# Patient Record
Sex: Male | Born: 1975 | Race: White | Hispanic: No | Marital: Single | Smoking: Never smoker
Health system: Southern US, Community
[De-identification: ages and names within clinical notes are randomized; demographics above are authoritative.]

## PROBLEM LIST (undated history)

## (undated) HISTORY — PX: INNER EAR SURGERY: SHX679

---

## 2006-03-28 ENCOUNTER — Emergency Department: Payer: Self-pay | Admitting: Unknown Physician Specialty

## 2007-09-17 ENCOUNTER — Emergency Department: Payer: Self-pay | Admitting: Emergency Medicine

## 2008-12-27 ENCOUNTER — Emergency Department: Payer: Self-pay | Admitting: Emergency Medicine

## 2012-01-01 ENCOUNTER — Ambulatory Visit: Payer: Self-pay | Admitting: Family Medicine

## 2014-12-20 ENCOUNTER — Encounter: Payer: Self-pay | Admitting: Family Medicine

## 2014-12-20 ENCOUNTER — Ambulatory Visit
Admission: EM | Admit: 2014-12-20 | Discharge: 2014-12-20 | Disposition: A | Discharge status: Home / Self Care | Payer: 59 | Attending: Family Medicine | Admitting: Family Medicine

## 2014-12-20 DIAGNOSIS — R51 Headache: Secondary | ICD-10-CM

## 2014-12-20 DIAGNOSIS — J0100 Acute maxillary sinusitis, unspecified: Secondary | ICD-10-CM | POA: Diagnosis not present

## 2014-12-20 DIAGNOSIS — R519 Headache, unspecified: Secondary | ICD-10-CM

## 2014-12-20 MED ORDER — FLUTICASONE PROPIONATE 50 MCG/ACT NA SUSP: 2.0000 | Freq: Every day | NASAL | Status: AC

## 2014-12-20 MED ORDER — AZITHROMYCIN 250 MG PO TABS: 250.0000 mg | ORAL_TABLET | Freq: Every day | ORAL | Status: AC

## 2014-12-20 NOTE — ED Notes (Signed)
Patient has been feeling bad for 5 days with fever, headache, body aches. He vomited in the parking lot today from the headache. He states for the past 2 years he will get headaches and they go on for a prolonged period they make him nauseous. He has never been diagnosed with migraines.

## 2014-12-20 NOTE — ED Provider Notes (Signed)
CSN: [id number]     Arrival date & time 12/20/14  0845 History   First MD Initiated Contact with Patient 12/20/14 [phone]     Chief Complaint  Patient presents with  . Fever    3 days  . Generalized Body Aches  . Emesis    today from headache  . Headache   (Consider location/radiation/quality/duration/timing/severity/associated sxs/prior Treatment) Patient is a 39 y.o. male presenting with fever, vomiting, and headaches. The history is provided by the patient.  Fever Temp source:  Oral Severity:  Moderate Onset quality:  Sudden Duration:  5 days Timing:  Constant Progression:  Partially resolved Chronicity:  New Relieved by:  Nothing Worsened by:  Nothing tried Associated symptoms: congestion, cough, ear pain, headaches, nausea and vomiting   Cough:    Cough characteristics:  Productive   Sputum characteristics:  Yellow   Severity:  Mild   Onset quality:  Sudden   Duration:  5 days   Timing:  Intermittent   Progression:  Partially resolved Headaches:    Severity:  Moderate   Onset quality:  Sudden   Timing:  Intermittent   Progression:  Waxing and waning (affecting right temporal/malar /orbital region. Sometimes associated with vomiting.) Emesis Associated symptoms: headaches   Headache Associated symptoms: congestion, cough, drainage, ear pain, fever, nausea, sinus pressure and vomiting   Associated symptoms: no hearing loss     History reviewed. No pertinent past medical history. Past Surgical History  Procedure Laterality Date  . Inner ear surgery     History reviewed. No pertinent family history. History  Substance Use Topics  . Smoking status: Never Smoker   . Smokeless tobacco: Not on file  . Alcohol Use: No    Review of Systems  Constitutional: Positive for fever.  HENT: Positive for congestion, ear pain, postnasal drip and sinus pressure. Negative for hearing loss.   Respiratory: Positive for cough.   Gastrointestinal: Positive for nausea and  vomiting.  Neurological: Positive for headaches.  All other systems reviewed and are negative.   Allergies  Review of patient's allergies indicates no known allergies.  Home Medications   Prior to Admission medications   Medication Sig Start Date End Date Taking? Authorizing Provider  Pseudoeph-Doxylamine-DM-APAP (NYQUIL MULTI-SYMPTOM PO) Take by mouth.   Yes Historical Provider, MD  azithromycin (ZITHROMAX Z-PAK) 250 MG tablet Take 1 tablet (250 mg total) by mouth daily. 12/20/14 12/25/14  Lutricia FeilWilliam P Kden Wagster, PA-C  fluticasone (FLONASE) 50 MCG/ACT nasal spray Place 2 sprays into both nostrils daily. 12/20/14   Chrissie NoaWilliam P Evalette Montrose, PA-C   BP 124/73 mmHg  Pulse 68  Temp(Src) 97.9 F (36.6 C) (Oral)  Resp 18  Ht 6\' 2"  (1.88 m)  Wt 220 lb (99.791 kg)  BMI 28.23 kg/m2  SpO2 98% Physical Exam  Constitutional: He is oriented to person, place, and time. He appears well-developed and well-nourished.  HENT:  Head: Normocephalic and atraumatic.  Left ear without landmarks from previous surgeries. No discharge. Right ear with effusion.  Eyes: EOM are normal. Pupils are equal, round, and reactive to light.  Neck: Normal range of motion. Neck supple. No thyromegaly present.  Cardiovascular: Normal rate, regular rhythm and normal heart sounds.  Exam reveals no gallop and no friction rub.   No murmur heard. Pulmonary/Chest: Effort normal and breath sounds normal. No stridor. No respiratory distress. He has no wheezes. He has no rales.  Abdominal: Soft. Bowel sounds are normal.  Musculoskeletal: Normal range of motion.  Lymphadenopathy:    He has  no cervical adenopathy.  Neurological: He is alert and oriented to person, place, and time. He has normal reflexes. No cranial nerve deficit.  Skin: Skin is warm and dry.  Psychiatric: He has a normal mood and affect. His behavior is normal. Judgment and thought content normal.    ED Course  Procedures (including critical care time) Labs Review Labs  Reviewed - No data to display  Imaging Review No results found.   MDM   1. Acute maxillary sinusitis, recurrence not specified   2. Acute nonintractable headache, unspecified headache type    I discussed with the patient findings. He has a sinusitis as well as a headache most likely from the pressure. He has had the headache in the past that was accompanied by vomiting and may be a variant of migraine and this should be addressed by her primary care physician. We'll place him on a short course of Z-Pak 5 day therapy and Flonase and he will use for the next month. Return to the clinic if he has any further problems or be seen by primary care physician.    Lutricia FeilWilliam P Dacey Milberger, PA-C 12/20/14 1026

## 2017-03-31 ENCOUNTER — Ambulatory Visit
Admission: EM | Admit: 2017-03-31 | Discharge: 2017-03-31 | Disposition: A | Discharge status: Home / Self Care | Payer: Managed Care, Other (non HMO) | Attending: Family Medicine | Admitting: Family Medicine

## 2017-03-31 ENCOUNTER — Encounter: Payer: Self-pay | Admitting: *Deleted

## 2017-03-31 ENCOUNTER — Ambulatory Visit (INDEPENDENT_AMBULATORY_CARE_PROVIDER_SITE_OTHER): Payer: Managed Care, Other (non HMO)

## 2017-03-31 DIAGNOSIS — S82892A Other fracture of left lower leg, initial encounter for closed fracture: Secondary | ICD-10-CM

## 2017-03-31 NOTE — ED Triage Notes (Signed)
Patient twisted his left ankle yesterday while training in martial arts.

## 2017-03-31 NOTE — ED Provider Notes (Signed)
[facility]    CSN: [id number] Arrival date & time: 03/31/17  1346     History   Chief Complaint Chief Complaint  Patient presents with  . Ankle Pain    HPI Todd Kent is a 41 y.o. male.   Patient is a 41 year old white male who is doing a martial art maneuver on Saturday he felt after person wrapped the legs around his ankle and went back a popping sensation. Since then he's had increasing pain in the left ankle. He has read rather googled about high ankle sprain of course also worried about whether he may have fractured a bone as well. Most of pain is over the lateral side of his left ankle near the lateral malleolus. No known drug allergies is in ear surgery years ago he does not smoke. No pertinent family medical history relevant to today's visit   The history is provided by the patient. No language interpreter was used.  Ankle Pain  Location:  Foot and ankle Ankle location:  L ankle Foot location:  L foot Pain details:    Quality:  Shooting and throbbing   Radiates to:  Does not radiate   Severity:  Moderate   Onset quality:  Sudden   Timing:  Constant   Progression:  Worsening Chronicity:  New Dislocation: no   Foreign body present:  No foreign bodies Relieved by:  Nothing   History reviewed. No pertinent past medical history.  There are no active problems to display for this patient.   Past Surgical History:  Procedure Laterality Date  . INNER EAR SURGERY         Home Medications    Prior to Admission medications   Medication Sig Start Date End Date Taking? Authorizing Provider  fluticasone (FLONASE) 50 MCG/ACT nasal spray Place 2 sprays into both nostrils daily. 12/20/14   Lutricia Feil, PA-C  Pseudoeph-Doxylamine-DM-APAP (NYQUIL MULTI-SYMPTOM PO) Take by mouth.    [provider]    Family History History reviewed. No pertinent family history.  Social History Social History  Substance Use Topics  . Smoking status:  Never Smoker  . Smokeless tobacco: Never Used  . Alcohol use No     Allergies   Patient has no known allergies.   Review of Systems Review of Systems  Musculoskeletal: Positive for arthralgias, gait problem and myalgias.  All other systems reviewed and are negative.    Physical Exam Triage Vital Signs ED Triage Vitals  Enc Vitals Group     BP 03/31/17 1401 (!) 147/78     Pulse Rate 03/31/17 1401 84     Resp 03/31/17 1401 16     Temp 03/31/17 1401 99.3 F (37.4 C)     Temp Source 03/31/17 1401 Oral     SpO2 03/31/17 1401 99 %     Weight 03/31/17 1401 235 lb (106.6 kg)     Height 03/31/17 1401 6\' 2"  (1.88 m)     Head Circumference --      Peak Flow --      Pain Score 03/31/17 1402 0     Pain Loc --      Pain Edu? --      Excl. in GC? --    No data found.   Updated Vital Signs BP (!) 147/78 (BP Location: Left Arm)   Pulse 84   Temp 99.3 F (37.4 C) (Oral)   Resp 16   Ht 6\' 2"  (1.88 m)   Wt 235 lb (106.6  kg)   SpO2 99%   BMI 30.17 kg/m   Visual Acuity Right Eye Distance:   Left Eye Distance:   Bilateral Distance:    Right Eye Near:   Left Eye Near:    Bilateral Near:     Physical Exam  Constitutional: He is oriented to person, place, and time. He appears well-developed and well-nourished.  HENT:  Head: Normocephalic and atraumatic.  Eyes: Pupils are equal, round, and reactive to light. EOM are normal.  Neck: Normal range of motion. Neck supple.  Pulmonary/Chest: Effort normal.  Musculoskeletal: He exhibits edema and tenderness.       Left ankle: He exhibits swelling. He exhibits normal range of motion. Tenderness. Lateral malleolus tenderness found. Achilles tendon exhibits pain.       Feet:  Patient has most tenderness over the left lateral malleolus and over the dorsum of the left foot  Neurological: He is alert and oriented to person, place, and time. No cranial nerve deficit.  Skin: Skin is warm.  Psychiatric: He has a normal mood and affect.   Vitals reviewed.    UC Treatments / Results  Labs (all labs ordered are listed, but only abnormal results are displayed) Labs Reviewed - No data to display  EKG  EKG Interpretation None       Radiology Dg Ankle Complete Left  Result Date: 03/31/2017 CLINICAL DATA:  Injured foot. EXAM: LEFT ANKLE COMPLETE - 3+ VIEW COMPARISON:  03/28/2006 . FINDINGS: Diffuse soft tissue swelling. Small minimally displaced avulsion fracture noted the distal tip of the medial malleolus. Small slightly displaced avulsion fracture noted of the dorsum of the distal talus. No other acute abnormalities identified. IMPRESSION: 1. Small minimally displaced avulsion fractures noted along the distal tip of the medial malleolus. 2. Small slightly displaced avulsion fracture noted of the dorsum of the distal talus. Electronically Signed   By: Maisie Fus  Register   On: 03/31/2017 14:36    Procedures Procedures (including critical care time)  Medications Ordered in UC Medications - No data to display   Initial Impression / Assessment and Plan / UC Course  I have reviewed the triage vital signs and the nursing notes.  Pertinent labs & imaging results that were available during my care of the patient were reviewed by me and considered in my medical decision making (see chart for details).   x-ray shows medial malleolus fracture which I wonder if assistance dental or previous problems since most of all his tenderness is over the lateral side he has a talus avulsion fracture which is probably the cause of most the pain that he is having we'll place him in a boot recommend follow-up podiatrist orthopedic of his choice next 3-4 days.     Final Clinical Impressions(s) / UC Diagnoses   Final diagnoses:  Closed fracture of left ankle, initial encounter    New Prescriptions Discharge Medication List as of 03/31/2017  2:53 PM     Note: This dictation was prepared with Dragon dictation along with smaller phrase  technology. Any transcriptional errors that result from this process are unintentional.  Controlled Substance Prescriptions [place] Controlled Substance Registry consulted? Not Applicable   Hassan Rowan, MD 03/31/17 [phone]

## 2018-12-29 IMAGING — CR DG ANKLE COMPLETE 3+V*L*
3 series · 3 of 3 positions shown · non-contrast
Comparison: 03/28/2006 .

CLINICAL DATA: Injured foot.

EXAM:
LEFT ANKLE COMPLETE - 3+ VIEW

[ankle ap]
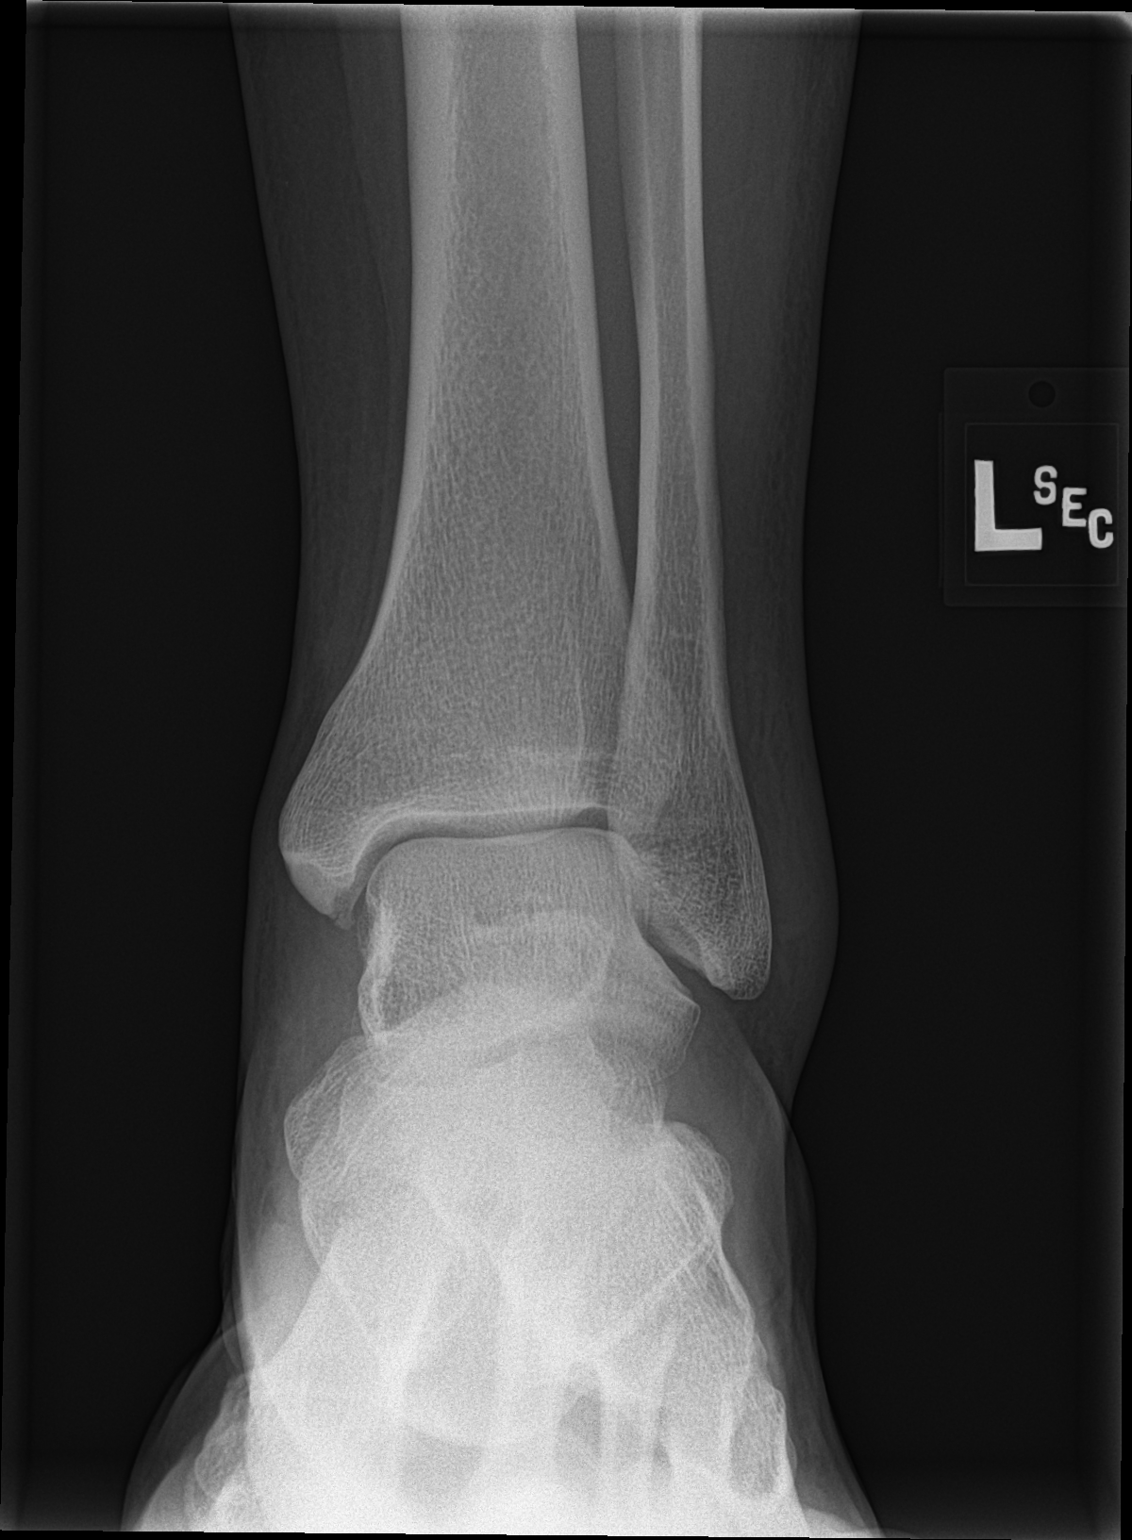

[ankle obl]
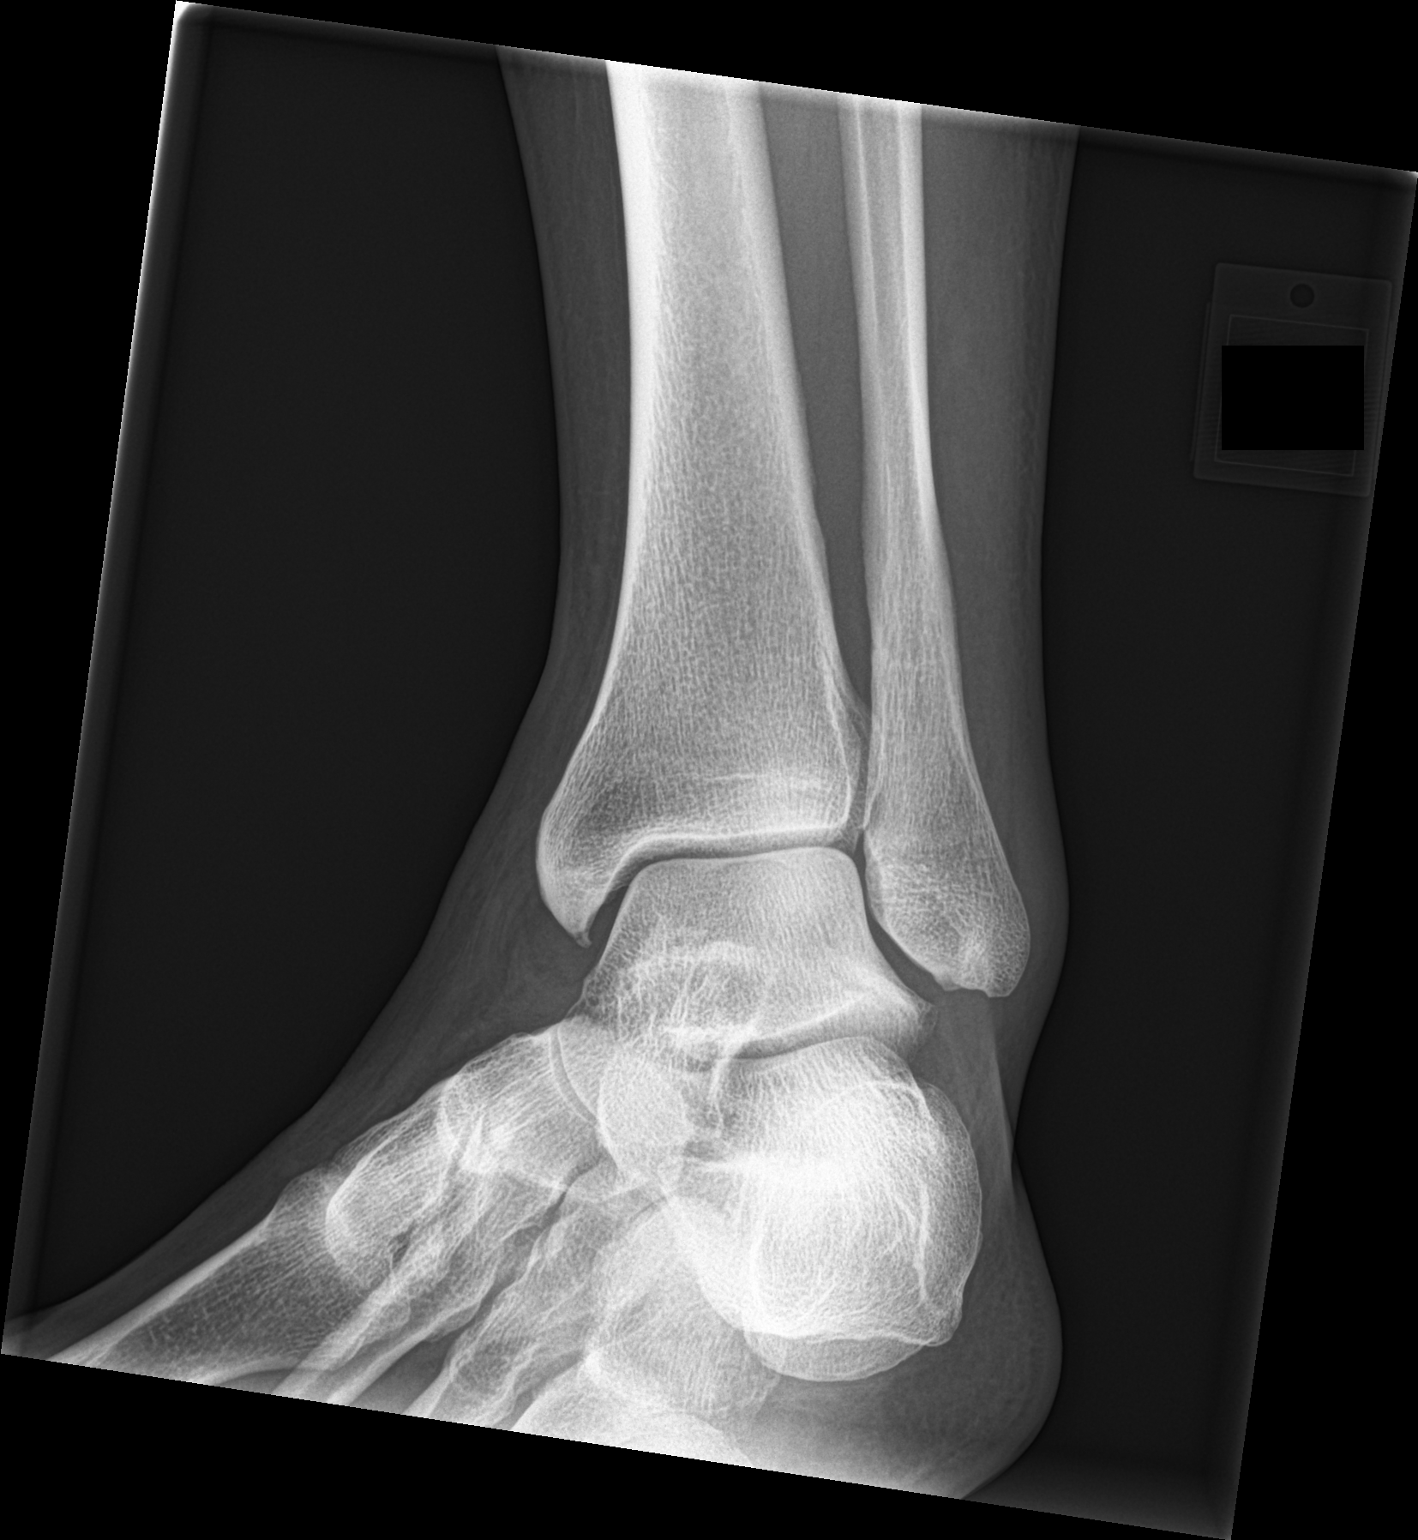

[ankle lat]
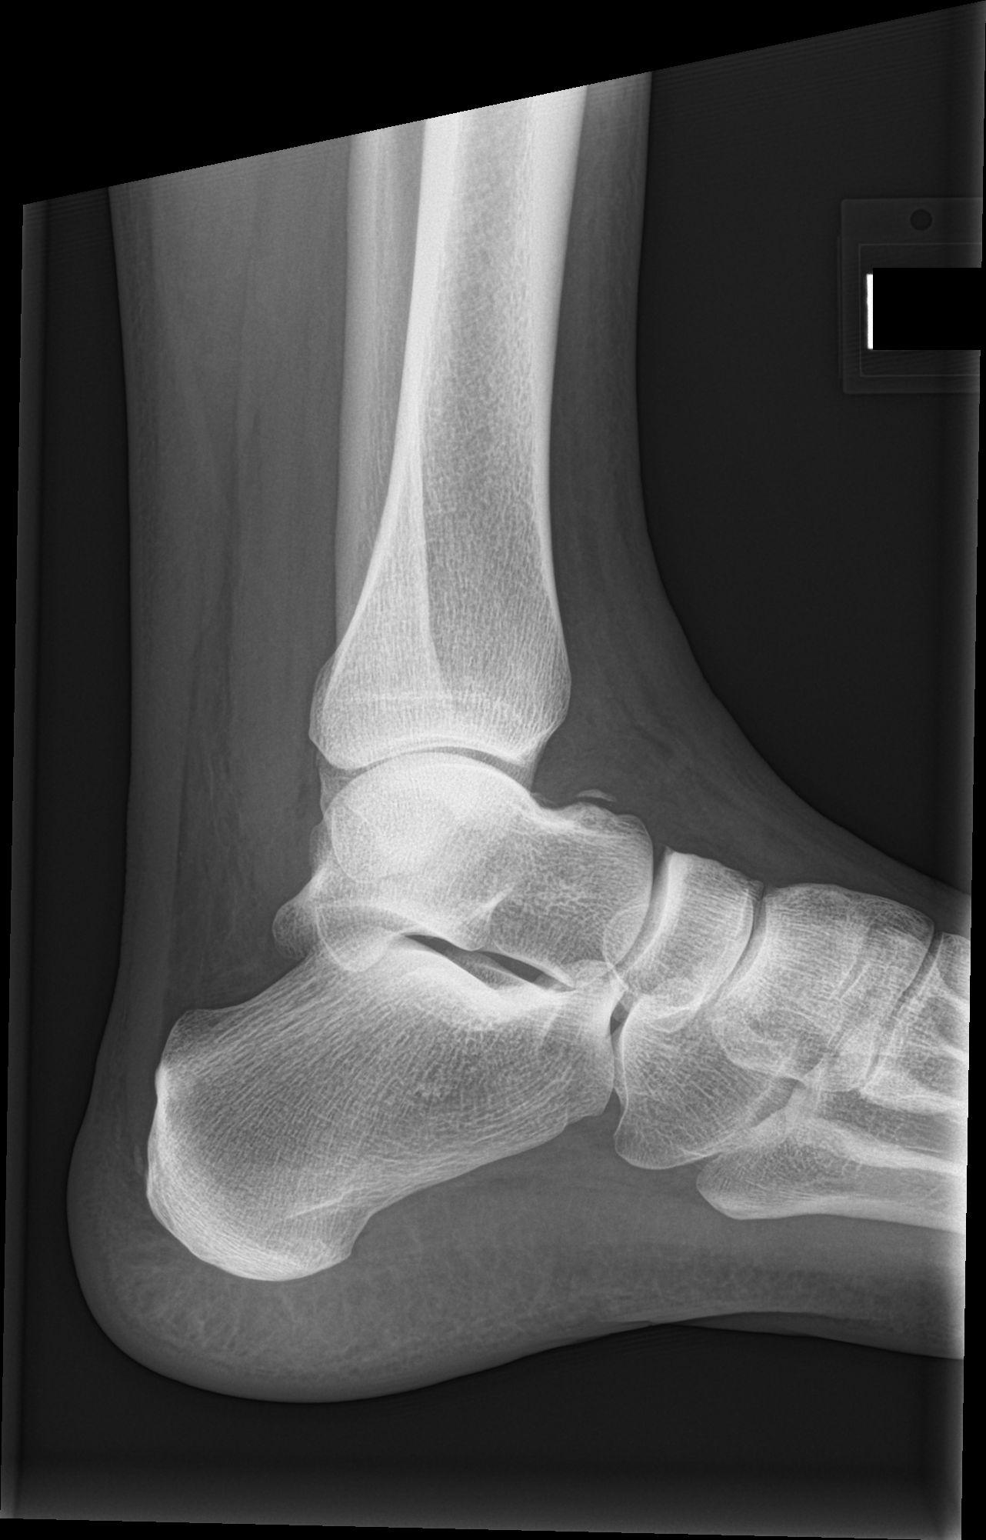

[3 of 3 positions shown; findings below may reference images not displayed]

FINDINGS: Diffuse soft tissue swelling. Small minimally displaced avulsion
fracture noted the distal tip of the medial malleolus. Small
slightly displaced avulsion fracture noted of the dorsum of the
distal talus. No other acute abnormalities identified.
IMPRESSION: 1. Small minimally displaced avulsion fractures noted along the
distal tip of the medial malleolus.

2. Small slightly displaced avulsion fracture noted of the dorsum of
the distal talus.
